# Patient Record
Sex: Female | Born: 1995 | Hispanic: Yes | Marital: Single | Smoking: Never smoker
Health system: Southern US, Community
[De-identification: ages and names within clinical notes are randomized; demographics above are authoritative.]

---
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
## 2013-03-27 ENCOUNTER — Other Ambulatory Visit: Payer: Self-pay | Admitting: Pediatrics

## 2013-03-27 LAB — GLUCOSE, 2 HOUR

## 2013-04-10 ENCOUNTER — Ambulatory Visit: Payer: Self-pay | Admitting: Gastroenterology

## 2013-06-04 ENCOUNTER — Other Ambulatory Visit: Payer: Self-pay | Admitting: Pediatrics

## 2013-06-04 LAB — TSH: Thyroid Stimulating Horm: 0.85 u[IU]/mL

## 2013-06-04 LAB — T4, FREE: Free Thyroxine: 1.02 ng/dL (ref 0.76–1.46)

## 2013-06-12 ENCOUNTER — Ambulatory Visit: Payer: Self-pay | Admitting: Gastroenterology

## 2013-07-15 ENCOUNTER — Ambulatory Visit: Payer: Self-pay | Admitting: Gastroenterology

## 2013-07-16 LAB — PATHOLOGY REPORT

## 2013-08-04 ENCOUNTER — Ambulatory Visit: Payer: Self-pay | Admitting: Family

## 2015-08-01 IMAGING — CT CT ABD-PELV W/ CM
1 of 2 series · 15 of 32 positions shown, 19 images · non-contrast
Comparison: none

REASON FOR EXAM: abd pain generalized nausea vomiting eval inflammatory
bowel
COMMENTS:

PROCEDURE:     KCT - KCT ABDOMEN/PELVIS W  - June 12, 2013 [DATE]
RESULT:
TECHNIQUE: Helical 3 mm sections were obtained from the lung bases through
the pubic symphysis status post intravenous administration of 85 mL
[id number] and oral contrast.

[Series 2: abd 3mm w 3.0 i40f 3 · axial · 0.69mm/px · z∈[-980,-569]mm · 15 of 151 slices shown, 19 images]
[im 7/151  soft-tissue]
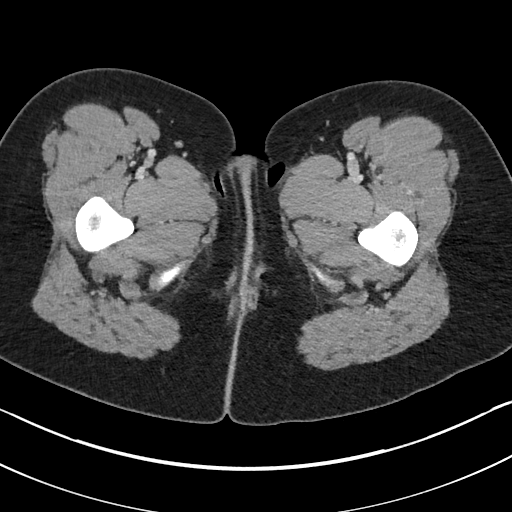
[im 7/151  bone]
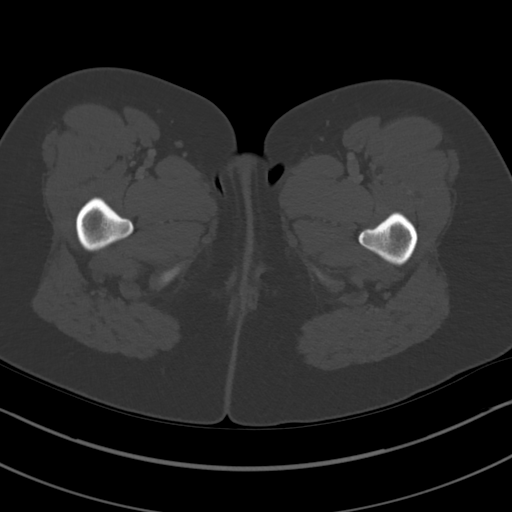
[im 20/151  soft-tissue]
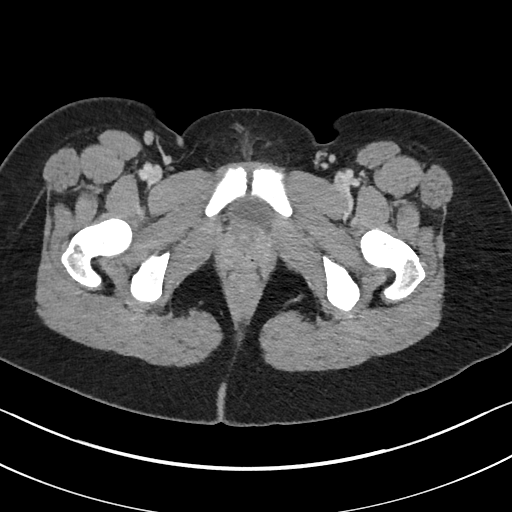
[im 33/151  soft-tissue]
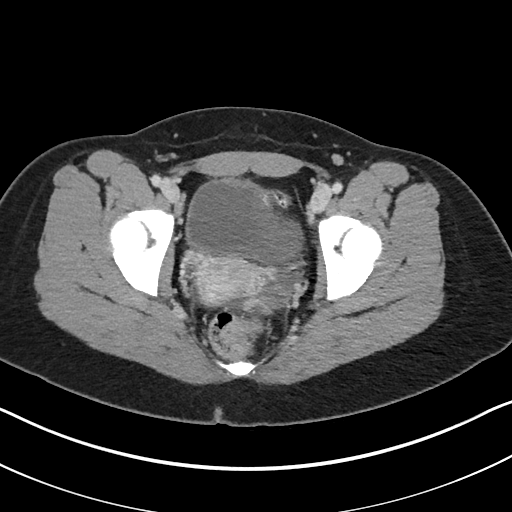
[im 40/151  soft-tissue]
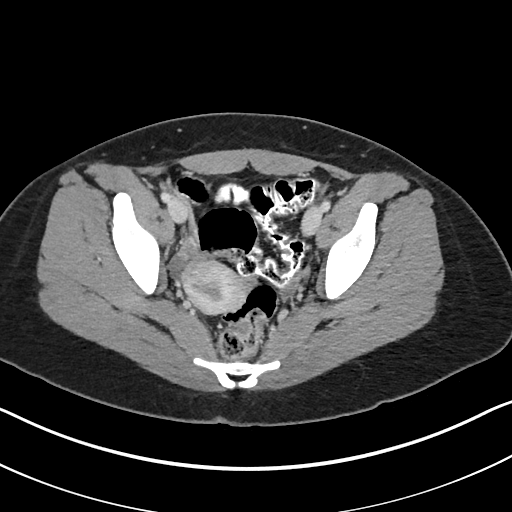
[im 53/151  soft-tissue]
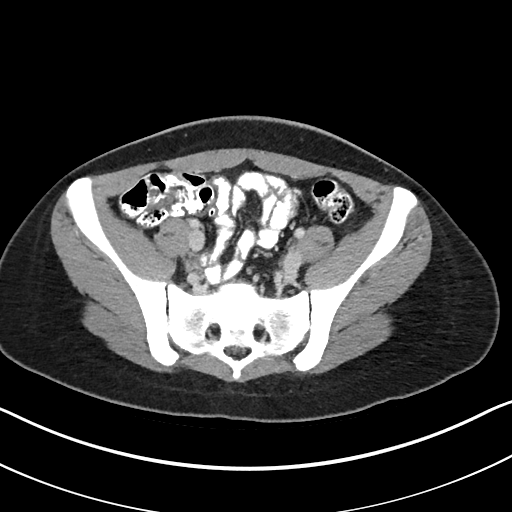
[im 66/151  soft-tissue]
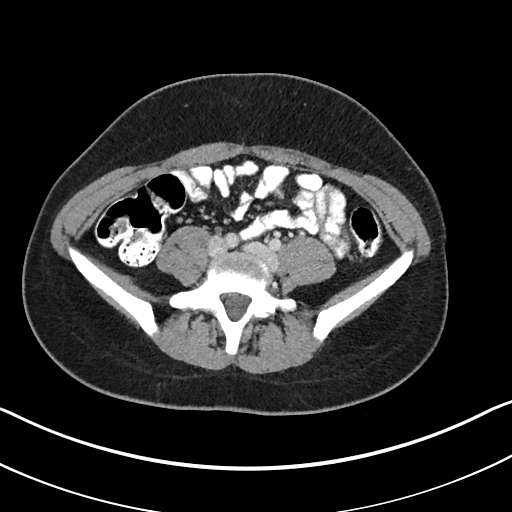
[im 79/151  soft-tissue]
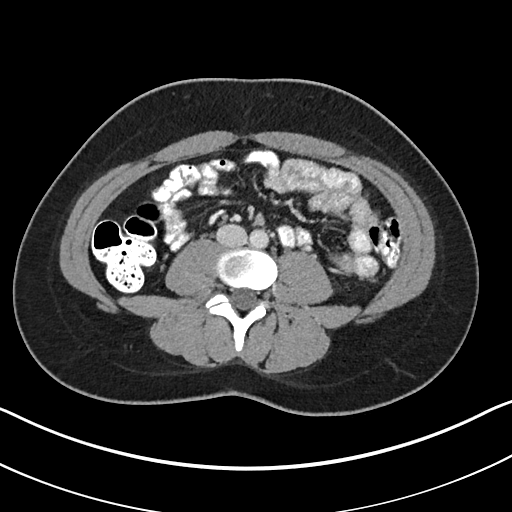
[im 85/151  soft-tissue]
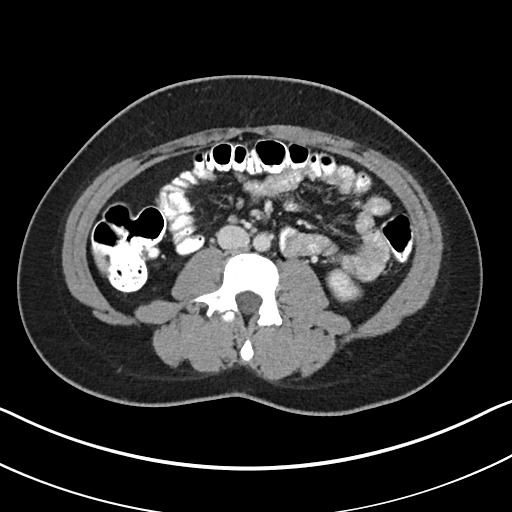
[im 98/151  soft-tissue]
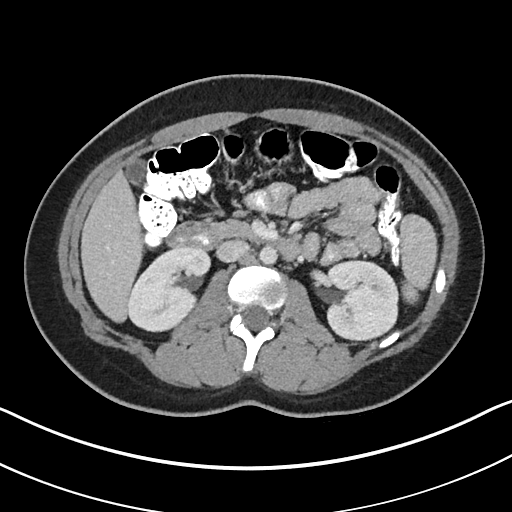
[im 98/151  bone]
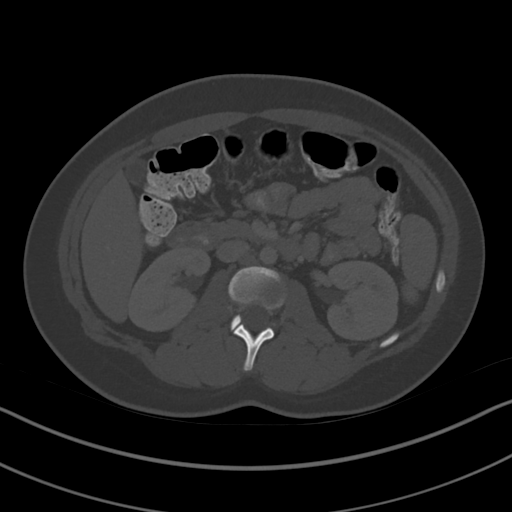
[im 111/151  soft-tissue]
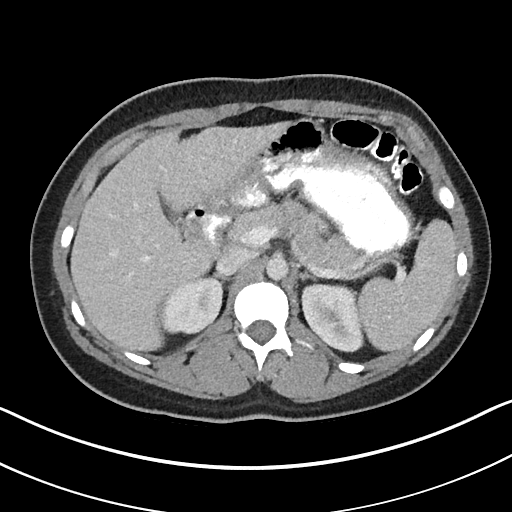
[im 118/151  soft-tissue]
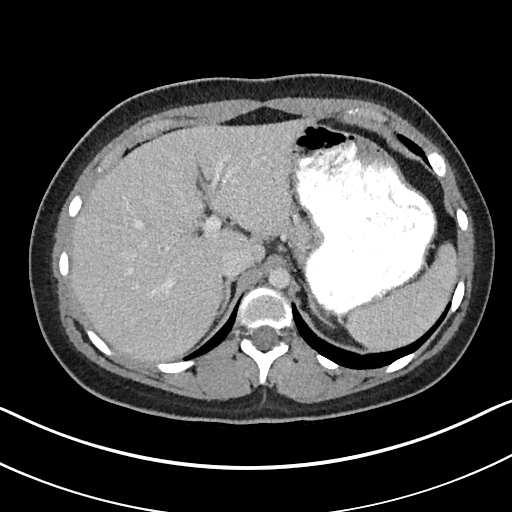
[im 124/151  lung]
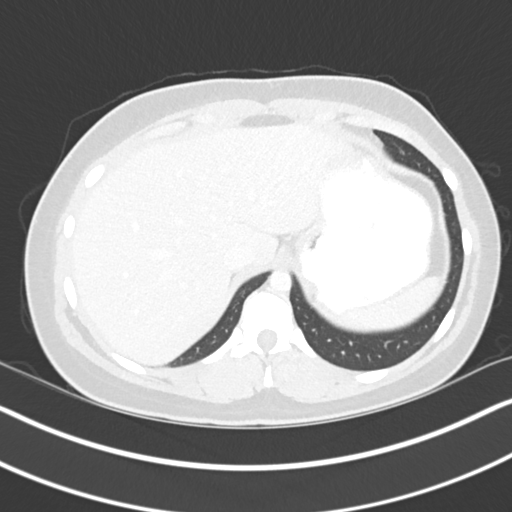
[im 131/151  soft-tissue]
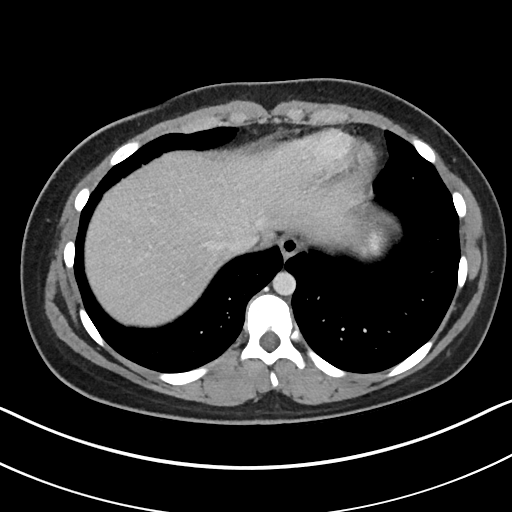
[im 131/151  lung]
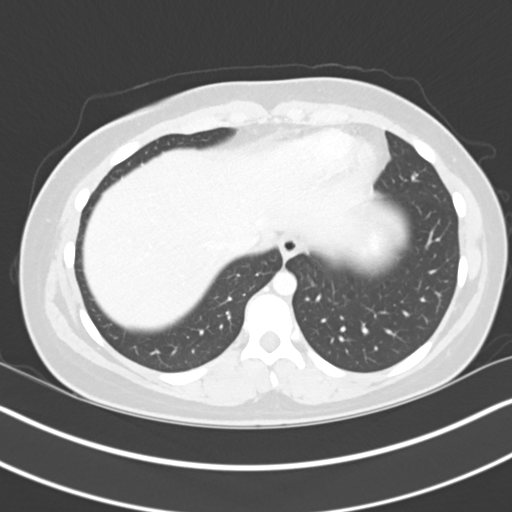
[im 137/151  lung]
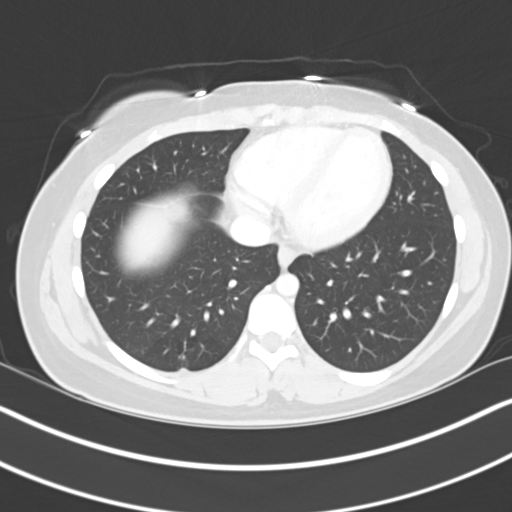
[im 144/151  soft-tissue]
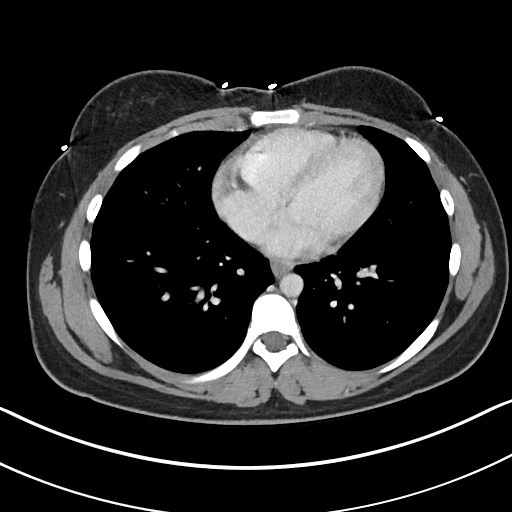
[im 144/151  lung]
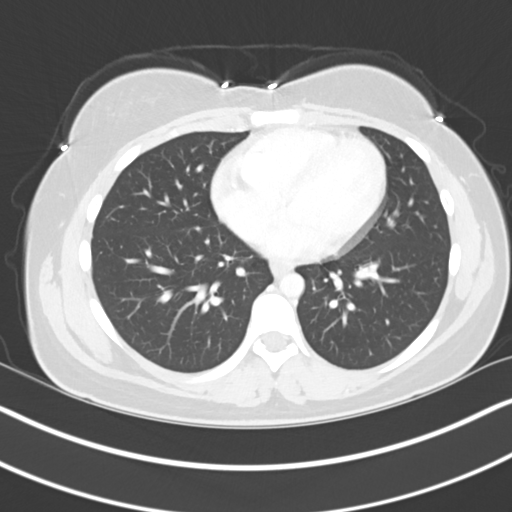

[15 of 32 positions shown; findings below may reference images not displayed]

FINDINGS: Evaluation of the lung bases demonstrates minimal areas of
hypoventilation versus minimal pleural thickening within the right lung base.

The liver, spleen, adrenals, pancreas, and kidneys are unremarkable.

Evaluation of the duodenal bulb, first and areas of the second portion of
the duodenum proximally demonstrate bowel wall thickening. There is no
evidence of associated free fluid or loculated fluid collections. There is
no evidence of bowel obstruction. Evaluation of the terminal ileum
demonstrates findings suspicious for luminal irregularity though this may be
secondary to decompression. Prominent lymph nodes are identified within the
right lower quadrant. There is no further evidence of enteritis, colitis or
diverticulitis. There is no evidence of an abdominal aortic aneurysm. There
is no evidence of abdominal or pelvic free fluid, loculated fluid
collections, masses, nor adenopathy.
IMPRESSION: 1. Findings which are suspicious for inflammatory changes involving the
duodenum and possibly the terminal ileum. Differential considerations are
infectious etiologies though inflammatory etiologies cannot be excluded and
surveillance evaluation is recommended.
2. No further evidence of obstructive or inflammatory abnormalities.
3. Note; the findings within the terminal ileum are appreciated on images
#92 through #97 and in the duodenum, images #43 through #60, soft tissue
series.

## 2019-03-23 ENCOUNTER — Other Ambulatory Visit: Payer: Self-pay

## 2019-03-23 ENCOUNTER — Emergency Department
Admission: EM | Admit: 2019-03-23 | Discharge: 2019-03-23 | Disposition: A | Discharge status: Home / Self Care | Payer: HRSA Program | Attending: Emergency Medicine | Admitting: Emergency Medicine

## 2019-03-23 DIAGNOSIS — Z1159 Encounter for screening for other viral diseases: Secondary | ICD-10-CM | POA: Diagnosis not present

## 2019-03-23 DIAGNOSIS — Z0279 Encounter for issue of other medical certificate: Secondary | ICD-10-CM | POA: Diagnosis present

## 2019-03-23 DIAGNOSIS — Z20828 Contact with and (suspected) exposure to other viral communicable diseases: Secondary | ICD-10-CM

## 2019-03-23 DIAGNOSIS — Z20822 Contact with and (suspected) exposure to covid-19: Secondary | ICD-10-CM

## 2019-03-23 NOTE — Discharge Instructions (Addendum)
Patient has been living with mother who has tested positive for Covid-19

## 2019-03-23 NOTE — ED Notes (Signed)
Pt needs a note for work - her mother was tested for Covid last pm and tested positive - pt lives with mother - pt denies any symptoms

## 2019-03-23 NOTE — ED Provider Notes (Signed)
[facility] Emergency Department Provider Note   ____________________________________________    I have reviewed the triage vital signs and the nursing notes.   HISTORY  Chief Complaint Letter for School/Work     HPI Stacey Wilkins is a 23 y.o. female who presents for work note.  Patient reports that she works at [employer].  Reportedly her mother was diagnosed with COVID-19.  She reports her mother has been quarantining in her room but she has been exposed significantly to the coronavirus.  Currently she feels quite well, afebrile, no shortness of breath, no cough.  No myalgias.   History reviewed. No pertinent past medical history.  There are no active problems to display for this patient.   History reviewed. No pertinent surgical history.  Prior to Admission medications   Not on File     Allergies Patient has no known allergies.  No family history on file.  Social History Social History   Tobacco Use  . Smoking status: Never Smoker  . Smokeless tobacco: Never Used  Substance Use Topics  . Alcohol use: Yes  . Drug use: Not Currently    Review of Systems  Constitutional: No fever/chills   Respiratory: No cough or shortness of  Gastrointestinal: No abdominal pain.  No nausea,  Genitourinary: Negative for dysuria. Musculoskeletal: No myalgias Skin: Negative for rash. Neurological: Negative for headaches     ____________________________________________   PHYSICAL EXAM:  VITAL SIGNS: ED Triage Vitals  Enc Vitals Group     BP 03/23/19 1046 (!) 153/80     Pulse Rate 03/23/19 1046 93     Resp 03/23/19 1046 17     Temp 03/23/19 1046 99.2 F (37.3 C)     Temp Source 03/23/19 1046 Oral     SpO2 03/23/19 1046 100 %     Weight 03/23/19 1044 75.8 kg (167 lb)     Height 03/23/19 1044 1.549 m (5\' 1" )     Head Circumference --      Peak Flow --      Pain Score 03/23/19 1044 0     Pain Loc --      Pain Edu?  --      Excl. in GC? --      Constitutional: Alert and oriented. Eyes: Conjunctivae are normal.     Cardiovascular: Normal rate, regular rhythm.  Respiratory: Normal respiratory effort.  No retractions.  Musculoskeletal: No lower extremity tenderness nor edema.   Neurologic:  Normal speech and language. No gross focal neurologic deficits are appreciated.   Skin:  Skin is warm, dry and intact. No rash noted.   ____________________________________________   LABS (all labs ordered are listed, but only abnormal results are displayed)  Labs Reviewed  NOVEL CORONAVIRUS, NAA (HOSPITAL ORDER, SEND-OUT TO REF LAB)   ____________________________________________  EKG   ____________________________________________  RADIOLOGY  None ____________________________________________   PROCEDURES  Procedure(s) performed: No  Procedures   Critical Care performed: No ____________________________________________   INITIAL IMPRESSION / ASSESSMENT AND PLAN / ED COURSE  Pertinent labs & imaging results that were available during my care of the patient were reviewed by me and considered in my medical decision making (see chart for details).  Patient well-appearing in no acute distress, we will send send out swab to [facility] no symptoms, work note provided   ____________________________________________   FINAL CLINICAL IMPRESSION(S) / ED DIAGNOSES  Final diagnoses:  Close Exposure to Severe Acute Respiratory Syndrome Coronavirus 2 (SARS-Cov-2)  NEW MEDICATIONS STARTED DURING THIS VISIT:  There are no discharge medications for this patient.    Note:  This document was prepared using Dragon voice recognition software and may include unintentional dictation errors.   Jene Every, MD 03/23/19 1453

## 2019-03-23 NOTE — ED Triage Notes (Signed)
Pt states her mother was brought here last night and tested +covid. States they live in the same house. Denies any sx. States she is here for a work note.

## 2019-03-24 ENCOUNTER — Telehealth: Payer: Self-pay | Admitting: Emergency Medicine

## 2019-03-24 LAB — NOVEL CORONAVIRUS, NAA (HOSP ORDER, SEND-OUT TO REF LAB; TAT 18-24 HRS): SARS-CoV-2, NAA: NOT DETECTED

## 2019-03-24 NOTE — Telephone Encounter (Signed)
Called patient and informed of negative covid 19 result.
